# Patient Record
Sex: Female | Born: 2000
Health system: Southern US, Community
[De-identification: ages and names within clinical notes are randomized; demographics above are authoritative.]

## PROBLEM LIST (undated history)

## (undated) HISTORY — PX: NO PAST SURGERIES: SHX2092

---
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
## 2022-01-09 ENCOUNTER — Other Ambulatory Visit: Payer: Self-pay | Admitting: Nurse Practitioner

## 2022-01-09 MED ORDER — LEVONORGESTREL-ETHINYL ESTRAD 0.15-30 MG-MCG PO TABS: 1.0000 | ORAL_TABLET | Freq: Every day | ORAL | 0 refills | Status: DC

## 2022-01-09 NOTE — Telephone Encounter (Signed)
Patient called, left VM to return the call to the office to scheduled an appt for medication refill request.   

## 2022-01-09 NOTE — Telephone Encounter (Signed)
Requested medication (s) are due for refill today: yes  Requested medication (s) are on the active medication list: yes  Last refill:  10/14/21 #28/1  Future visit scheduled: no  Notes to clinic:  Unable to refill per protocol, appointment needed. Called pt, LVMTCB     Requested Prescriptions  Pending Prescriptions Disp Refills   KURVELO 0.15-30 MG-MCG tablet [Pharmacy Med Name: KURVELO TABLETS 28S] 28 tablet 1    Sig: TAKE 1 TABLET BY MOUTH EVERY DAY     OB/GYN:  Contraceptives Failed - 01/09/2022  4:36 PM      Failed - Valid encounter within last 12 months    Recent Outpatient Visits           1 year ago Menorrhagia with irregular cycle   [facility] [place], Dorie Rank, NP   1 year ago Cough   [facility] [place], Corrie Dandy T, NP   1 year ago Appointment canceled by hospital   [facility] [place], Megan P, DO   2 years ago Menorrhagia with irregular cycle   [facility] [place], Corrie Dandy T, NP   2 years ago Encounter to establish care   [facility] [place], [place] T, NP              Passed - Last BP in normal range    BP Readings from Last 1 Encounters:  10/23/20 100/69         Passed - Patient is not a smoker

## 2022-01-09 NOTE — Telephone Encounter (Signed)
Pt is due for an appt.  Requested Prescriptions  Pending Prescriptions Disp Refills  . KURVELO 0.15-30 MG-MCG tablet [Pharmacy Med Name: Williemae Natter TABLETS 28S] 84 tablet     Sig: TAKE 1 TABLET BY MOUTH DAILY     OB/GYN:  Contraceptives Failed - 01/09/2022  5:30 PM      Failed - Valid encounter within last 12 months    Recent Outpatient Visits          1 year ago Menorrhagia with irregular cycle   [facility] [place], Dorie Rank, NP   1 year ago Cough   [facility] [place], Corrie Dandy T, NP   1 year ago Appointment canceled by hospital   [facility], Megan P, DO   2 years ago Menorrhagia with irregular cycle   [facility] [place], Corrie Dandy T, NP   2 years ago Encounter to establish care   [facility] [place], Dorie Rank, NP      Future Appointments            Tomorrow Larae Grooms, NP [facility], [facility]           Passed - Last BP in normal range    BP Readings from Last 1 Encounters:  10/23/20 100/69         Passed - Patient is not a smoker

## 2022-01-09 NOTE — Telephone Encounter (Signed)
Copied from CRM [id number]. Topic: General - Other >> Jan 09, 2022  4:37 PM Everette C wrote: Reason for CRM: Medication Refill - Medication: Jeanne Benson 0.15-30 MG-MCG tablet [[id number]]  Has the patient contacted their pharmacy? Yes.  The patient has been directed to contact their PCP (Agent: If no, request that the patient contact the pharmacy for the refill. If patient does not wish to contact the pharmacy document the reason why and proceed with request.) (Agent: If yes, when and what did the pharmacy advise?)  Preferred Pharmacy (with phone number or street name): [id number] [name], [place] - [address] AT [facility] [address] [place] [address] [place] [address] Phone: [phone] Fax: [phone] Hours: Not open 24 hours   Has the patient been seen for an appointment in the last year OR does the patient have an upcoming appointment? Yes.    Agent: Please be advised that RX refills may take up to 3 business days. We ask that you follow-up with your pharmacy.

## 2022-01-09 NOTE — Telephone Encounter (Signed)
Patient will need an office visit for further refills Courtesy refill. Requested Prescriptions  Pending Prescriptions Disp Refills  . levonorgestrel-ethinyl estradiol (KURVELO) 0.15-30 MG-MCG tablet 28 tablet 0    Sig: Take 1 tablet by mouth daily.     OB/GYN:  Contraceptives Failed - 01/09/2022  5:15 PM      Failed - Valid encounter within last 12 months    Recent Outpatient Visits          1 year ago Menorrhagia with irregular cycle   [facility], Dorie Rank, NP   1 year ago Cough   [facility] [place], Corrie Dandy T, NP   1 year ago Appointment canceled by hospital   [facility], Megan P, DO   2 years ago Menorrhagia with irregular cycle   [facility] [place], Corrie Dandy T, NP   2 years ago Encounter to establish care   [facility] [place], [place]raw T, NP             Passed - Last BP in normal range    BP Readings from Last 1 Encounters:  10/23/20 100/69         Passed - Patient is not a smoker

## 2022-01-09 NOTE — Telephone Encounter (Signed)
Called pt to make appt. LMOM ?

## 2022-01-10 ENCOUNTER — Encounter: Payer: Self-pay | Admitting: Nurse Practitioner

## 2022-01-10 ENCOUNTER — Ambulatory Visit (INDEPENDENT_AMBULATORY_CARE_PROVIDER_SITE_OTHER): Payer: 59 | Admitting: Nurse Practitioner

## 2022-01-10 VITALS — BP 129/82 | HR 69 | Temp 98.6°F | Wt 145.0 lb

## 2022-01-10 DIAGNOSIS — Z3009 Encounter for other general counseling and advice on contraception: Secondary | ICD-10-CM | POA: Diagnosis not present

## 2022-01-10 MED ORDER — LEVONORGESTREL-ETHINYL ESTRAD 0.15-30 MG-MCG PO TABS: 1.0000 | ORAL_TABLET | Freq: Every day | ORAL | 3 refills | Status: DC

## 2022-01-10 NOTE — Progress Notes (Signed)
BP 129/82   Pulse 69   Temp 98.6 F (37 C) (Oral)   Wt 145 lb (65.8 kg)   LMP 01/09/2022   SpO2 99%    Subjective:    Patient ID: Jeanne Benson, female    DOB: August 09, 2000, 21 y.o.   MRN: [id number]  HPI: Jeanne Benson is a 21 y.o. female  Chief Complaint  Patient presents with   Medication Refill    Refill on birth control    Patient presents to clinic for medication refill.  She needs a refill on her birth control.  States medication is working well for her.  Denies concerns at visit today.     Relevant past medical, surgical, family and social history reviewed and updated as indicated. Interim medical history since our last visit reviewed. Allergies and medications reviewed and updated.  Review of Systems  Genitourinary:  Negative for menstrual problem.    Per HPI unless specifically indicated above     Objective:    BP 129/82   Pulse 69   Temp 98.6 F (37 C) (Oral)   Wt 145 lb (65.8 kg)   LMP 01/09/2022   SpO2 99%   Wt Readings from Last 3 Encounters:  01/10/22 145 lb (65.8 kg) (77 %, Z= 0.73)*  10/23/20 151 lb 12.8 oz (68.9 kg) (85 %, Z= 1.05)*  09/30/19 165 lb (74.8 kg) (93 %, Z= 1.45)*   * Growth percentiles are based on CDC (Girls, 2-20 Years) data.    Physical Exam Vitals and nursing note reviewed.  Constitutional:      General: She is not in acute distress.    Appearance: Normal appearance. She is normal weight. She is not ill-appearing, toxic-appearing or diaphoretic.  HENT:     Head: Normocephalic.     Right Ear: External ear normal.     Left Ear: External ear normal.     Nose: Nose normal.     Mouth/Throat:     Mouth: Mucous membranes are moist.     Pharynx: Oropharynx is clear.  Eyes:     General:        Right eye: No discharge.        Left eye: No discharge.     Extraocular Movements: Extraocular movements intact.     Conjunctiva/sclera: Conjunctivae normal.     Pupils: Pupils are equal, round, and reactive to light.   Cardiovascular:     Rate and Rhythm: Normal rate and regular rhythm.     Heart sounds: No murmur heard. Pulmonary:     Effort: Pulmonary effort is normal. No respiratory distress.     Breath sounds: Normal breath sounds. No wheezing or rales.  Musculoskeletal:     Cervical back: Normal range of motion and neck supple.  Skin:    General: Skin is warm and dry.     Capillary Refill: Capillary refill takes less than 2 seconds.  Neurological:     General: No focal deficit present.     Mental Status: She is alert and oriented to person, place, and time. Mental status is at baseline.  Psychiatric:        Mood and Affect: Mood normal.        Behavior: Behavior normal.        Thought Content: Thought content normal.        Judgment: Judgment normal.     Results for orders placed or performed in visit on 10/23/20  CBC with Differential/Platelet  Result Value Ref Range  WBC 7.1 3.4 - 10.8 x10E3/uL   RBC 4.19 3.77 - 5.28 x10E6/uL   Hemoglobin 11.7 11.1 - 15.9 g/dL   Hematocrit 36.5 34.0 - 46.6 %   MCV 87 79 - 97 fL   MCH 27.9 26.6 - 33.0 pg   MCHC 32.1 31.5 - 35.7 g/dL   RDW 14.5 11.7 - 15.4 %   Platelets 431 150 - 450 x10E3/uL   Neutrophils 53 Not Estab. %   Lymphs 38 Not Estab. %   Monocytes 7 Not Estab. %   Eos 1 Not Estab. %   Basos 1 Not Estab. %   Neutrophils Absolute 3.8 1.4 - 7.0 x10E3/uL   Lymphocytes Absolute 2.7 0.7 - 3.1 x10E3/uL   Monocytes Absolute 0.5 0.1 - 0.9 x10E3/uL   EOS (ABSOLUTE) 0.1 0.0 - 0.4 x10E3/uL   Basophils Absolute 0.0 0.0 - 0.2 x10E3/uL   Immature Granulocytes 0 Not Estab. %   Immature Grans (Abs) 0.0 0.0 - 0.1 x10E3/uL  Comprehensive metabolic panel  Result Value Ref Range   Glucose 64 (L) 65 - 99 mg/dL   BUN 14 6 - 20 mg/dL   Creatinine, Ser 0.64 0.57 - 1.00 mg/dL   eGFR 131 >59 mL/min/1.73   BUN/Creatinine Ratio 22 9 - 23   Sodium 143 134 - 144 mmol/L   Potassium 3.9 3.5 - 5.2 mmol/L   Chloride 101 96 - 106 mmol/L   CO2 25 20 - 29  mmol/L   Calcium 9.2 8.7 - 10.2 mg/dL   Total Protein 6.7 6.0 - 8.5 g/dL   Albumin 4.3 3.9 - 5.0 g/dL   Globulin, Total 2.4 1.5 - 4.5 g/dL   Albumin/Globulin Ratio 1.8 1.2 - 2.2   Bilirubin Total <0.2 0.0 - 1.2 mg/dL   Alkaline Phosphatase 43 42 - 106 IU/L   AST 15 0 - 40 IU/L   ALT 17 0 - 32 IU/L  Pregnancy, urine  Result Value Ref Range   Preg Test, Ur Negative Negative  Iron, TIBC and Ferritin Panel  Result Value Ref Range   Total Iron Binding Capacity 373 250 - 450 ug/dL   UIBC 346 131 - 425 ug/dL   Iron 27 27 - 159 ug/dL   Iron Saturation 7 (LL) 15 - 55 %   Ferritin 32 15 - 77 ng/mL      Assessment & Plan:   Problem List Items Addressed This Visit       Other   Birth control counseling - Primary    Well controlled with current medication.  Denies break through bleeding. Will send 1 year refill to pharmacy.  Follow up in 1 year for reevaluation.         Follow up plan: Return in about 1 year (around 01/11/2023) for Physical and Fasting labs.

## 2022-01-10 NOTE — Addendum Note (Signed)
Addended by: Larae Grooms on: 01/10/2022 02:11 PM   Modules accepted: Orders

## 2022-01-10 NOTE — Assessment & Plan Note (Signed)
Well controlled with current medication.  Denies break through bleeding. Will send 1 year refill to pharmacy.  Follow up in 1 year for reevaluation.

## 2022-02-05 ENCOUNTER — Other Ambulatory Visit: Payer: Self-pay | Admitting: Nurse Practitioner

## 2022-02-05 NOTE — Telephone Encounter (Signed)
Rx 01/10/22 #84 3RF- 1 yr Requested Prescriptions  Pending Prescriptions Disp Refills  . KURVELO 0.15-30 MG-MCG tablet [Pharmacy Med Name: Williemae Natter TABLETS 28S] 28 tablet     Sig: TAKE 1 TABLET BY MOUTH DAILY     OB/GYN:  Contraceptives Passed - 02/05/2022  3:15 AM      Passed - Last BP in normal range    BP Readings from Last 1 Encounters:  01/10/22 129/82         Passed - Valid encounter within last 12 months    Recent Outpatient Visits          3 weeks ago Birth control counseling   [facility] Larae Grooms, NP   1 year ago Menorrhagia with irregular cycle   [facility] [place], Dorie Rank, NP   1 year ago Cough   [facility] [place], Corrie Dandy T, NP   1 year ago Appointment canceled by hospital   [facility], Megan P, DO   2 years ago Menorrhagia with irregular cycle   [facility] [place], Dorie Rank, NP      Future Appointments            In 8 months Cann[place], Dorie Rank, NP [facility], [facility]           Passed - Patient is not a smoker

## 2022-04-14 ENCOUNTER — Other Ambulatory Visit: Payer: Self-pay

## 2022-04-14 MED ORDER — LEVONORGESTREL-ETHINYL ESTRAD 0.15-30 MG-MCG PO TABS
1.0000 | ORAL_TABLET | Freq: Every day | ORAL | 3 refills | Status: DC
Start: 2022-04-14 — End: 2022-04-18

## 2022-04-14 NOTE — Telephone Encounter (Signed)
[facility] req 90 days for birth control. Last seen in office 01/10/2022. Upcoming appt for 10/24/2022, please advise.

## 2022-04-18 ENCOUNTER — Other Ambulatory Visit: Payer: Self-pay | Admitting: Nurse Practitioner

## 2022-04-18 MED ORDER — LEVONORGESTREL-ETHINYL ESTRAD 0.15-30 MG-MCG PO TABS: 1.0000 | ORAL_TABLET | Freq: Every day | ORAL | 3 refills | Status: DC

## 2022-04-18 NOTE — Telephone Encounter (Signed)
Change of pharmacy  Requested Prescriptions  Pending Prescriptions Disp Refills  . levonorgestrel-ethinyl estradiol (KURVELO) 0.15-30 MG-MCG tablet 84 tablet 3    Sig: Take 1 tablet by mouth daily.     OB/GYN:  Contraceptives Passed - 04/18/2022 12:31 PM      Passed - Last BP in normal range    BP Readings from Last 1 Encounters:  01/10/22 129/82         Passed - Valid encounter within last 12 months    Recent Outpatient Visits          3 months ago Birth control counseling   [facility] Jon Billings, NP   1 year ago Menorrhagia with irregular cycle   [place], Barbaraann Faster, NP   1 year ago Cough   [place], Barbaraann Faster, NP   1 year ago Appointment canceled by hospital   [facility], Megan P, DO   2 years ago Menorrhagia with irregular cycle   [place], Barbaraann Faster, NP      Future Appointments            In 6 months Cannady, Barbaraann Faster, NP [employer], [place] - Patient is not a smoker

## 2022-04-18 NOTE — Telephone Encounter (Signed)
Copied from [place] [id number]. Topic: General - Other >> Apr 18, 2022 11:28 AM Everette C wrote: Reason for CRM: Medication Refill - Medication: levonorgestrel-ethinyl estradiol (KURVELO) 0.15-30 MG-MCG tablet [[id number]]   Has the patient contacted their pharmacy? Yes.  The patient's pharmacy has made contact on their behalf  (Agent: If no, request that the patient contact the pharmacy for the refill. If patient does not wish to contact the pharmacy document the reason why and proceed with request.) (Agent: If yes, when and what did the pharmacy advise?)  Preferred Pharmacy (with phone number or street name): [place], [place] [address] Phone: [phone] Fax: [phone] Hours: Not open 24 hours   Has the patient been seen for an appointment in the last year OR does the patient have an upcoming appointment? Yes.    Agent: Please be advised that RX refills may take up to 3 business days. We ask that you follow-up with your pharmacy.

## 2022-10-24 ENCOUNTER — Encounter: Payer: 59 | Admitting: Nurse Practitioner

## 2022-12-16 ENCOUNTER — Telehealth: Payer: Self-pay

## 2022-12-16 NOTE — Telephone Encounter (Signed)
LVM for patient to call back [phone], or to call PCP office to schedule follow up apt. AS, CMA  

## 2023-02-09 ENCOUNTER — Telehealth: Payer: Self-pay | Admitting: Nurse Practitioner

## 2023-02-09 NOTE — Telephone Encounter (Signed)
Physicals are out til October 30th with her provider.  Not sure if there is somewhere she could be worked in.  Please advise.

## 2023-02-09 NOTE — Telephone Encounter (Signed)
Copied from CRM [id number]. Topic: Appointment Scheduling - Scheduling Inquiry for Clinic >> Feb 09, 2023 12:07 PM Jeanne Benson wrote: Reason for CRM: Pt is calling back stating that she does not remember where she received her last physical from nor the date. Pt states that she is needing her physical to be done before she start school on 02/13/2023. Pt is wanting to see if she can get Benson physical this week. Please call pt back.

## 2023-02-09 NOTE — Telephone Encounter (Signed)
LVM for patient to call office back, can be scheduled with Erin or Rashelle.  Put in CRM.

## 2023-02-09 NOTE — Telephone Encounter (Signed)
PCP has no openings this week. Please call patient and schedule her with another provider if she needs the appointment this week.

## 2023-02-11 ENCOUNTER — Ambulatory Visit (INDEPENDENT_AMBULATORY_CARE_PROVIDER_SITE_OTHER): Payer: 59 | Admitting: Physician Assistant

## 2023-02-11 ENCOUNTER — Encounter: Payer: Self-pay | Admitting: Physician Assistant

## 2023-02-11 VITALS — BP 100/67 | HR 76 | Ht 69.5 in | Wt 154.4 lb

## 2023-02-11 DIAGNOSIS — Z7189 Other specified counseling: Secondary | ICD-10-CM | POA: Insufficient documentation

## 2023-02-11 DIAGNOSIS — R7989 Other specified abnormal findings of blood chemistry: Secondary | ICD-10-CM

## 2023-02-11 DIAGNOSIS — Z0289 Encounter for other administrative examinations: Secondary | ICD-10-CM

## 2023-02-11 DIAGNOSIS — Z136 Encounter for screening for cardiovascular disorders: Secondary | ICD-10-CM

## 2023-02-11 DIAGNOSIS — Z1159 Encounter for screening for other viral diseases: Secondary | ICD-10-CM

## 2023-02-11 DIAGNOSIS — Z0000 Encounter for general adult medical examination without abnormal findings: Secondary | ICD-10-CM | POA: Diagnosis not present

## 2023-02-11 DIAGNOSIS — Z131 Encounter for screening for diabetes mellitus: Secondary | ICD-10-CM

## 2023-02-11 DIAGNOSIS — Z114 Encounter for screening for human immunodeficiency virus [HIV]: Secondary | ICD-10-CM

## 2023-02-11 NOTE — Progress Notes (Signed)
Annual Physical Exam   Name: Jeanne Benson   MRN: [id number]    DOB: 09-25-2000   Date:02/11/2023  Today's Provider: Jacquelin Hawking, MHS, PA-C Introduced myself to the patient as a PA-C and provided education on APPs in clinical practice.         Subjective  Chief Complaint  Chief Complaint  Patient presents with   Annual Exam   Form Completion    HPI  Patient presents for annual CPE.  Diet: overall normal dietary intake  Exercise: She not engaged in regular exercise   Sleep:" pretty good" getting about 6-7 hours per night, feels well rested in the AM  Mood:"good"    Depression: Phq 9 is  negative    02/11/2023   10:46 AM 01/10/2022    8:28 AM 10/23/2020    1:19 PM  Depression screen PHQ 2/9  Decreased Interest 0 1 0  Down, Depressed, Hopeless 0 0 0  PHQ - 2 Score 0 1 0  Altered sleeping 0 0   Tired, decreased energy 0 0   Change in appetite 0 0   Feeling bad or failure about yourself  0 0   Trouble concentrating 0 0   Moving slowly or fidgety/restless 0 0   Suicidal thoughts 0 0   PHQ-9 Score 0 1   Difficult doing work/chores Not difficult at all Not difficult at all    Hypertension: BP Readings from Last 3 Encounters:  02/11/23 100/67  01/10/22 129/82  10/23/20 100/69   Obesity: Wt Readings from Last 3 Encounters:  02/11/23 154 lb 6.4 oz (70 kg)  01/10/22 145 lb (65.8 kg) (77%, Z= 0.73)*  10/23/20 151 lb 12.8 oz (68.9 kg) (85%, Z= 1.05)*   * Growth percentiles are based on CDC (Girls, 2-20 Years) data.   BMI Readings from Last 3 Encounters:  02/11/23 22.47 kg/m  08/31/19 24.81 kg/m (84%, Z= 0.98)*   * Growth percentiles are based on CDC (Girls, 2-20 Years) data.     Vaccines:   HPV: completed  Tdap: UTD  Shingrix: NA Pneumonia: NA Flu: postponed until fall  COVID-19: Discussed vaccine and booster recommendations per available CDC guidelines     Hep C Screening: ordered today  HIV screening: ordered today  STD testing and  prevention (HIV/chl/gon/syphilis): declines today  Intimate partner violence: negative Sexual History : She is sexually active with single monogamous female partner  Menstrual History/LMP/Abnormal Bleeding: LMP was 01/29/23-02/02/23 Discussed importance of follow up if any post-menopausal bleeding: not applicable Incontinence Symptoms: No.  Breast cancer hx: she denies family hx of breast cancer  - Last Mammogram: Recommend routine screening at age 75  - BRCA gene screening:   Osteoporosis Prevention : Discussed high calcium and vitamin D supplementation, weight bearing exercises Bone density :not applicable  Cervical cancer screening: not due yet   Skin cancer: Discussed monitoring for atypical lesions  Colorectal cancer screening: She denies known family hx of colon cancer, recommend routine screening at age 13    Lung cancer:  Low Dose CT Chest recommended if Age 21-80 years, 20 pack-year currently smoking OR have quit w/in 15years. Patient does not qualify.   ECG: NA  Advanced Care Planning: A voluntary discussion about advance care planning including the explanation and discussion of advance directives.  Discussed health care proxy and Living will, and the patient was able to identify a health care proxy as no one.  Patient does not have a living will in effect.  Lipids: No results found for: "CHOL" No results found for: "HDL" No results found for: "LDLCALC" No results found for: "TRIG" No results found for: "CHOLHDL" No results found for: "LDLDIRECT"  Glucose: Glucose  Date Value Ref Range Status  10/23/2020 64 (L) 65 - 99 mg/dL Final  11/91/4782 77 65 - 99 mg/dL Final    Patient Active Problem List   Diagnosis Date Noted   Advanced care planning/counseling discussion 02/11/2023   Iron deficiency anemia 09/30/2019   Menorrhagia with irregular cycle 08/31/2019   Birth control counseling 08/31/2019    Past Surgical History:  Procedure Laterality Date   NO PAST SURGERIES       Family History  Problem Relation Age of Onset   Asthma Mother    COPD Maternal Grandmother    Lung cancer Maternal Grandfather    Heart disease Maternal Grandfather    Heart disease Paternal Grandfather     Social History   Socioeconomic History   Marital status: Single    Spouse name: Not on file   Number of children: Not on file   Years of education: Not on file   Highest education level: Not on file  Occupational History   Not on file  Tobacco Use   Smoking status: Never   Smokeless tobacco: Never  Vaping Use   Vaping status: Never Used  Substance and Sexual Activity   Alcohol use: No    Alcohol/week: 0.0 standard drinks of alcohol   Drug use: No   Sexual activity: Not on file  Other Topics Concern   Not on file  Social History Narrative   Not on file   Social Determinants of Health   Financial Resource Strain: Not on file  Food Insecurity: Not on file  Transportation Needs: Not on file  Physical Activity: Not on file  Stress: Not on file  Social Connections: Not on file  Intimate Partner Violence: Not on file     Current Outpatient Medications:    levonorgestrel-ethinyl estradiol (KURVELO) 0.15-30 MG-MCG tablet, Take 1 tablet by mouth daily., Disp: 84 tablet, Rfl: 3  No Known Allergies   Review of Systems  Constitutional:  Negative for chills, fever, malaise/fatigue and weight loss.  HENT:  Negative for hearing loss, nosebleeds, sore throat and tinnitus.   Eyes:  Negative for blurred vision, double vision and photophobia.  Respiratory:  Negative for shortness of breath and wheezing.   Cardiovascular:  Negative for chest pain, palpitations and leg swelling.  Gastrointestinal:  Negative for blood in stool, constipation, diarrhea, heartburn, nausea and vomiting.  Genitourinary:  Negative for dysuria and hematuria.  Musculoskeletal:  Negative for falls, joint pain and myalgias.  Skin:  Negative for itching and rash.  Neurological:  Negative for  dizziness, tingling, tremors, loss of consciousness, weakness and headaches.  Psychiatric/Behavioral:  Negative for depression, memory loss, substance abuse and suicidal ideas. The patient is not nervous/anxious.    Vision Screening   Right eye Left eye Both eyes  Without correction 20/70 20/40 20/40   With correction         Objective  Vitals:   02/11/23 1040  BP: 100/67  Pulse: 76  SpO2: 98%  Weight: 154 lb 6.4 oz (70 kg)  Height: 5' 9.5" (1.765 m)    Body mass index is 22.47 kg/m.  Physical Exam Vitals reviewed.  Constitutional:      General: She is awake.     Appearance: Normal appearance. She is well-developed and well-groomed.  HENT:  Head: Normocephalic and atraumatic.     Right Ear: Hearing, tympanic membrane and ear canal normal.     Left Ear: Hearing, tympanic membrane and ear canal normal.     Mouth/Throat:     Lips: Pink.     Mouth: Mucous membranes are moist.     Pharynx: Oropharynx is clear. Uvula midline. No pharyngeal swelling, oropharyngeal exudate, posterior oropharyngeal erythema or uvula swelling.     Tonsils: No tonsillar exudate or tonsillar abscesses.  Eyes:     General: Lids are normal. Gaze aligned appropriately.     Extraocular Movements: Extraocular movements intact.     Conjunctiva/sclera: Conjunctivae normal.     Pupils: Pupils are equal, round, and reactive to light.  Neck:     Thyroid: No thyroid mass, thyromegaly or thyroid tenderness.  Cardiovascular:     Rate and Rhythm: Normal rate and regular rhythm.     Pulses: Normal pulses.          Radial pulses are 2+ on the right side and 2+ on the left side.     Heart sounds: Normal heart sounds. No murmur heard.    No friction rub. No gallop.     Comments: Examined in [place] - no changes or concerning heart sounds  Pulmonary:     Effort: Pulmonary effort is normal.     Breath sounds: Normal breath sounds. No decreased air movement. No decreased breath sounds, wheezing, rhonchi or  rales.  Abdominal:     General: Abdomen is flat. Bowel sounds are normal.     Palpations: Abdomen is soft.     Tenderness: There is no abdominal tenderness.  Musculoskeletal:     Right shoulder: Normal.     Left shoulder: Normal.     Right upper arm: Normal.     Left upper arm: Normal.     Cervical back: Normal, normal range of motion and neck supple. Normal range of motion.     Thoracic back: Normal. Normal range of motion.     Lumbar back: Normal. Normal range of motion.     Right hip: Normal range of motion.     Left hip: Normal range of motion.     Right knee: Normal range of motion.     Left knee: Normal range of motion.     Right lower leg: No edema.     Left lower leg: No edema.     Right foot: Normal range of motion.     Left foot: Normal range of motion.  Lymphadenopathy:     Head:     Right side of head: No submental, submandibular or preauricular adenopathy.     Left side of head: No submental, submandibular or preauricular adenopathy.     Cervical:     Right cervical: No superficial or posterior cervical adenopathy.    Left cervical: No superficial or posterior cervical adenopathy.     Upper Body:     Right upper body: No supraclavicular adenopathy.     Left upper body: No supraclavicular adenopathy.  Neurological:     General: No focal deficit present.     Mental Status: She is alert and oriented to person, place, and time. Mental status is at baseline.     GCS: GCS eye subscore is 4. GCS verbal subscore is 5. GCS motor subscore is 6.     Cranial Nerves: No cranial nerve deficit, dysarthria or facial asymmetry.     Motor: No weakness, tremor, atrophy or abnormal muscle tone.  Gait: Gait is intact. Gait normal.  Psychiatric:        Attention and Perception: Attention and perception normal.        Mood and Affect: Mood and affect normal.        Speech: Speech normal.        Behavior: Behavior normal. Behavior is cooperative.        Thought Content: Thought  content normal.        Cognition and Memory: Cognition normal.        Judgment: Judgment normal.      No results found for this or any previous visit (from the past 2160 hour(s)).   Fall Risk:    02/11/2023   10:46 AM 01/10/2022    8:27 AM 08/31/2019    1:11 PM  Fall Risk   Falls in the past year? 0 0 0  Number falls in past yr: 0 0 0  Injury with Fall? 0 0 0  Risk for fall due to : No Fall Risks No Fall Risks   Follow up Falls evaluation completed Falls evaluation completed Falls evaluation completed     Functional Status Survey:     Assessment & Plan  Problem List Items Addressed This Visit       Other   Advanced care planning/counseling discussion    A voluntary discussion about advance care planning including the explanation and discussion of advance directives was extensively discussed  with the patient for 5 minutes with patient and myself present.  Explanation about the health care proxy and Living will was reviewed and packet with forms with explanation of how to fill them out was given.  During this discussion, the patient was not  able to identify a health care proxy and plans to fill out the paperwork required.  Patient was offered a separate Advance Care Planning visit for further assistance with forms.         Other Visit Diagnoses     Annual physical exam    -  Primary  -USPSTF grade A and B recommendations reviewed with patient; age-appropriate recommendations, preventive care, screening tests, etc discussed and encouraged; healthy living encouraged; see AVS for patient education given to patient -Discussed importance of 150 minutes of physical activity weekly, eat two servings of fish weekly, eat one serving of tree nuts ( cashews, pistachios, pecans, almonds.Marland Kitchen) every other day, eat 6 servings of fruit/vegetables daily and drink plenty of water and avoid sweet beverages.   -Reviewed Health Maintenance: Yes.      Relevant Orders   Comp Met (CMET)   CBC  w/Diff   HIV antibody (with reflex)   Hepatitis C antibody   Lipid Profile   HgB A1c   TSH   Encounter for completion of form with patient     See scanned form for more information  Sports physical form for [place]     Screening for ischemic heart disease       Relevant Orders   Lipid Profile   Encounter for hepatitis C screening test for low risk patient       Relevant Orders   Hepatitis C antibody   Screening for HIV without presence of risk factors       Relevant Orders   HIV antibody (with reflex)   Screening for diabetes mellitus (DM)       Relevant Orders   HgB A1c        No follow-ups on file.   I, Subhan Hoopes E Kember Boch, PA-C,  have reviewed all documentation for this visit. The documentation on 02/11/23 for the exam, diagnosis, procedures, and orders are all accurate and complete.   Jacquelin Hawking, MHS, PA-C [facility]

## 2023-02-11 NOTE — Assessment & Plan Note (Signed)

## 2023-02-12 LAB — CBC WITH DIFFERENTIAL/PLATELET
Basophils Absolute: 0 10*3/uL (ref 0.0–0.2)
Basos: 1 %
EOS (ABSOLUTE): 0.1 10*3/uL (ref 0.0–0.4)
Eos: 3 %
Hematocrit: 35.6 % (ref 34.0–46.6)
Hemoglobin: 11.4 g/dL (ref 11.1–15.9)
Immature Grans (Abs): 0 10*3/uL (ref 0.0–0.1)
Immature Granulocytes: 0 %
Lymphocytes Absolute: 1.7 10*3/uL (ref 0.7–3.1)
Lymphs: 39 %
MCH: 28.1 pg (ref 26.6–33.0)
MCHC: 32 g/dL (ref 31.5–35.7)
MCV: 88 fL (ref 79–97)
Monocytes Absolute: 0.3 10*3/uL (ref 0.1–0.9)
Monocytes: 6 %
Neutrophils Absolute: 2.3 10*3/uL (ref 1.4–7.0)
Neutrophils: 51 %
Platelets: 268 10*3/uL (ref 150–450)
RBC: 4.06 x10E6/uL (ref 3.77–5.28)
RDW: 14.7 % (ref 11.7–15.4)
WBC: 4.3 10*3/uL (ref 3.4–10.8)

## 2023-02-12 LAB — LIPID PANEL
Chol/HDL Ratio: 3.7 ratio (ref 0.0–4.4)
Cholesterol, Total: 167 mg/dL (ref 100–199)
HDL: 45 mg/dL (ref >39)
LDL Chol Calc (NIH): 110 mg/dL — ABNORMAL HIGH (ref 0–99)
Triglycerides: 62 mg/dL (ref 0–149)
VLDL Cholesterol Cal: 12 mg/dL (ref 5–40)

## 2023-02-12 LAB — COMPREHENSIVE METABOLIC PANEL
ALT: 9 IU/L (ref 0–32)
AST: 19 IU/L (ref 0–40)
Albumin: 4.2 g/dL (ref 4.0–5.0)
Alkaline Phosphatase: 35 IU/L — ABNORMAL LOW (ref 42–106)
BUN/Creatinine Ratio: 13 (ref 9–23)
BUN: 9 mg/dL (ref 6–20)
Bilirubin Total: 0.4 mg/dL (ref 0.0–1.2)
CO2: 24 mmol/L (ref 20–29)
Calcium: 9.6 mg/dL (ref 8.7–10.2)
Chloride: 104 mmol/L (ref 96–106)
Creatinine, Ser: 0.68 mg/dL (ref 0.57–1.00)
Globulin, Total: 2.4 g/dL (ref 1.5–4.5)
Glucose: 83 mg/dL (ref 70–99)
Potassium: 4.4 mmol/L (ref 3.5–5.2)
Sodium: 139 mmol/L (ref 134–144)
Total Protein: 6.6 g/dL (ref 6.0–8.5)
eGFR: 128 mL/min/{1.73_m2} (ref >59)

## 2023-02-12 LAB — HEPATITIS C ANTIBODY: Hep C Virus Ab: NONREACTIVE

## 2023-02-12 LAB — HIV ANTIBODY (ROUTINE TESTING W REFLEX): HIV Screen 4th Generation wRfx: NONREACTIVE

## 2023-02-12 LAB — HEMOGLOBIN A1C
Est. average glucose Bld gHb Est-mCnc: 103 mg/dL
Hgb A1c MFr Bld: 5.2 % (ref 4.8–5.6)

## 2023-02-12 LAB — TSH: TSH: 0.385 u[IU]/mL — ABNORMAL LOW (ref 0.450–4.500)

## 2023-02-12 NOTE — Addendum Note (Signed)
Addended by: Jacquelin Hawking on: 02/12/2023 05:09 PM   Modules accepted: Orders

## 2023-02-12 NOTE — Progress Notes (Signed)
Your labs were overall normal and stable with the exception of your thyroid testing. I have submitted an extra lab request to the lab to see if they can run another test to make sure your other levels are okay. We will keep you updated on these results once they are back. For now please make sure you are staying active, well hydrated and eating a balanced diet.

## 2023-02-14 LAB — THYROID PANEL
Free Thyroxine Index: 2.1 (ref 1.2–4.9)
T3 Uptake Ratio: 26 % (ref 24–39)
T4, Total: 8.1 ug/dL (ref 4.5–12.0)

## 2023-02-14 LAB — SPECIMEN STATUS REPORT

## 2023-02-16 NOTE — Progress Notes (Signed)
Thyroid hormone levels were normal. No medications indicated at this time

## 2023-04-30 ENCOUNTER — Other Ambulatory Visit: Payer: Self-pay | Admitting: Nurse Practitioner

## 2023-04-30 NOTE — Telephone Encounter (Signed)
Requested Prescriptions  Pending Prescriptions Disp Refills   KURVELO 0.15-30 MG-MCG tablet [Pharmacy Med Name: Williemae Natter TABLETS 28S] 84 tablet 3    Sig: TAKE 1 TABLET BY MOUTH DAILY     OB/GYN:  Contraceptives Passed - 04/30/2023  3:23 AM      Passed - Last BP in normal range    BP Readings from Last 1 Encounters:  02/11/23 100/67         Passed - Valid encounter within last 12 months    Recent Outpatient Visits           2 months ago Annual physical exam   [place] [facility] Mecum, Oswaldo Conroy, PA-C   1 year ago Birth control counseling   [place] [facility] Larae Grooms, NP   2 years ago Menorrhagia with irregular cycle   [place] [facility] [place], Corrie Dandy T, NP   2 years ago Cough   [place] [facility] [place], Corrie Dandy T, NP   2 years ago Appointment canceled by hospital    [facility] Clarissa, [place], [place]              Passed - Patient is not a smoker

## 2024-03-23 ENCOUNTER — Encounter: Admitting: Nurse Practitioner

## 2024-04-17 ENCOUNTER — Other Ambulatory Visit: Payer: Self-pay | Admitting: Nurse Practitioner

## 2024-04-19 NOTE — Telephone Encounter (Signed)
 Requested Prescriptions  Pending Prescriptions Disp Refills   levonorgestrel -ethinyl estradiol (KURVELO) 0.15-30 MG-MCG tablet [Pharmacy Med Name: KURVELO TABLETS 28S] 84 tablet 0    Sig: TAKE 1 TABLET BY MOUTH DAILY     OB/GYN:  Contraceptives Failed - 04/19/2024 11:49 AM      Failed - Valid encounter within last 12 months    Recent Outpatient Visits   None            Passed - Last BP in normal range    BP Readings from Last 1 Encounters:  02/11/23 100/67         Passed - Patient is not a smoker

## 2024-04-19 NOTE — Telephone Encounter (Signed)
 Copied from CRM #[id number]. Topic: Clinical - Prescription Issue >> Apr 19, 2024 11:14 AM Delon DASEN wrote: Reason for CRM: KURVELO 0.15-30 MG-MCG tablet- per pharmacy there has been a delay with refill

## 2024-04-30 NOTE — Patient Instructions (Incomplete)
 Be Involved in Caring For Your Health:  Taking Medications When medications are taken as directed, they can greatly improve your health. But if they are not taken as prescribed, they may not work. In some cases, not taking them correctly can be harmful. To help ensure your treatment remains effective and safe, understand your medications and how to take them. Bring your medications to each visit for review by your provider.  Your lab results, notes, and after visit summary will be available on My Chart. We strongly encourage you to use this feature. If lab results are abnormal the clinic will contact you with the appropriate steps. If the clinic does not contact you assume the results are satisfactory. You can always view your results on My Chart. If you have questions regarding your health or results, please contact the clinic during office hours. You can also ask questions on My Chart.  We at [facility] are grateful that you chose us  to provide your care. We strive to provide evidence-based and compassionate care and are always looking for feedback. If you get a survey from the clinic please complete this so we can hear your opinions.  Healthy Eating, Adult Healthy eating may help you get and keep a healthy body weight, reduce the risk of chronic disease, and live a long and productive life. It is important to follow a healthy eating pattern. Your nutritional and calorie needs should be met mainly by different nutrient-rich foods. What are tips for following this plan? Reading food labels Read labels and choose the following: Reduced or low sodium products. Juices with 100% fruit juice. Foods with low saturated fats (<3 g per serving) and high polyunsaturated and monounsaturated fats. Foods with whole grains, such as whole wheat, cracked wheat, brown rice, and wild rice. Whole grains that are fortified with folic acid. This is recommended for females who are pregnant or who want to  become pregnant. Read labels and do not eat or drink the following: Foods or drinks with added sugars. These include foods that contain brown sugar, corn sweetener, corn syrup, dextrose , fructose, glucose, high-fructose corn syrup, honey, invert sugar, lactose, malt syrup, maltose, molasses, raw sugar, sucrose, trehalose, or turbinado sugar. Limit your intake of added sugars to less than 10% of your total daily calories. Do not eat more than the following amounts of added sugar per day: 6 teaspoons (25 g) for females. 9 teaspoons (38 g) for males. Foods that contain processed or refined starches and grains. Refined grain products, such as white flour, degermed cornmeal, white bread, and white rice. Shopping Choose nutrient-rich snacks, such as vegetables, whole fruits, and nuts. Avoid high-calorie and high-sugar snacks, such as potato chips, fruit snacks, and candy. Use oil-based dressings and spreads on foods instead of solid fats such as butter, margarine, sour cream, or cream cheese. Limit pre-made sauces, mixes, and instant products such as flavored rice, instant noodles, and ready-made pasta. Try more plant-protein sources, such as tofu, tempeh, black beans, edamame, lentils, nuts, and seeds. Explore eating plans such as the Mediterranean diet or vegetarian diet. Try heart-healthy dips made with beans and healthy fats like hummus and guacamole. Vegetables go great with these. Cooking Use oil to saut or stir-fry foods instead of solid fats such as butter, margarine, or lard. Try baking, boiling, grilling, or broiling instead of frying. Remove the fatty part of meats before cooking. Steam vegetables in water  or broth. Meal planning  At meals, imagine dividing your plate into fourths: One-half of  your plate is fruits and vegetables. One-fourth of your plate is whole grains. One-fourth of your plate is protein, especially lean meats, poultry, eggs, tofu, beans, or nuts. Include low-fat  dairy as part of your daily diet. Lifestyle Choose healthy options in all settings, including home, work, school, restaurants, or stores. Prepare your food safely: Wash your hands after handling raw meats. Where you prepare food, keep surfaces clean by regularly washing with hot, soapy water . Keep raw meats separate from ready-to-eat foods, such as fruits and vegetables. Cook seafood, meat, poultry, and eggs to the recommended temperature. Get a food thermometer. Store foods at safe temperatures. In general: Keep cold foods at 84F (4.4C) or below. Keep hot foods at 184F (60C) or above. Keep your freezer at [facility] (-17.8C) or below. Foods are not safe to eat if they have been between the temperatures of 40-184F (4.4-60C) for more than 2 hours. What foods should I eat? Fruits Aim to eat 1-2 cups of fresh, canned (in natural juice), or frozen fruits each day. One cup of fruit equals 1 small apple, 1 large banana, 8 large strawberries, 1 cup (237 g) canned fruit,  cup (82 g) dried fruit, or 1 cup (240 mL) 100% juice. Vegetables Aim to eat 2-4 cups of fresh and frozen vegetables each day, including different varieties and colors. One cup of vegetables equals 1 cup (91 g) broccoli or cauliflower florets, 2 medium carrots, 2 cups (150 g) raw, leafy greens, 1 large tomato, 1 large bell pepper, 1 large sweet potato, or 1 medium white potato. Grains Aim to eat 5-10 ounce-equivalents of whole grains each day. Examples of 1 ounce-equivalent of grains include 1 slice of bread, 1 cup (40 g) ready-to-eat cereal, 3 cups (24 g) popcorn, or  cup (93 g) cooked rice. Meats and other proteins Try to eat 5-7 ounce-equivalents of protein each day. Examples of 1 ounce-equivalent of protein include 1 egg,  oz nuts (12 almonds, 24 pistachios, or 7 walnut halves), 1/4 cup (90 g) cooked beans, 6 tablespoons (90 g) hummus or 1 tablespoon (16 g) peanut butter. A cut of meat or fish that is the size of a deck of  cards is about 3-4 ounce-equivalents (85 g). Of the protein you eat each week, try to have at least 8 sounce (227 g) of seafood. This is about 2 servings per week. This includes salmon, trout, herring, sardines, and anchovies. Dairy Aim to eat 3 cup-equivalents of fat-free or low-fat dairy each day. Examples of 1 cup-equivalent of dairy include 1 cup (240 mL) milk, 8 ounces (250 g) yogurt, 1 ounces (44 g) natural cheese, or 1 cup (240 mL) fortified soy milk. Fats and oils Aim for about 5 teaspoons (21 g) of fats and oils per day. Choose monounsaturated fats, such as canola and olive oils, mayonnaise made with olive oil or avocado oil, avocados, peanut butter, and most nuts, or polyunsaturated fats, such as sunflower, corn, and soybean oils, walnuts, pine nuts, sesame seeds, sunflower seeds, and flaxseed. Beverages Aim for 6 eight-ounce glasses of water  per day. Limit coffee to 3-5 eight-ounce cups per day. Limit caffeinated beverages that have added calories, such as soda and energy drinks. If you drink alcohol: Limit how much you have to: 0-1 drink a day if you are female. 0-2 drinks a day if you are female. Know how much alcohol is in your drink. In the [place] one drink is one 12 oz bottle of beer (355 mL), one 5 oz glass of wine (  148 mL), or one 1 oz glass of hard liquor (44 mL). Seasoning and other foods Try not to add too much salt to your food. Try using herbs and spices instead of salt. Try not to add sugar to food. This information is based on [place] nutrition guidelines. To learn more, visit DisposableNylon.be. Exact amounts may vary. You may need different amounts. This information is not intended to replace advice given to you by your health care provider. Make sure you discuss any questions you have with your health care provider. Document Revised: 03/17/2022 Document Reviewed: 03/17/2022 Elsevier Patient Education  2024 [employer].

## 2024-05-03 ENCOUNTER — Encounter: Admitting: Nurse Practitioner

## 2024-05-03 DIAGNOSIS — Z124 Encounter for screening for malignant neoplasm of cervix: Secondary | ICD-10-CM

## 2024-05-03 DIAGNOSIS — D508 Other iron deficiency anemias: Secondary | ICD-10-CM

## 2024-05-03 DIAGNOSIS — N921 Excessive and frequent menstruation with irregular cycle: Secondary | ICD-10-CM

## 2024-05-03 DIAGNOSIS — Z0000 Encounter for general adult medical examination without abnormal findings: Secondary | ICD-10-CM

## 2024-05-03 DIAGNOSIS — Z789 Other specified health status: Secondary | ICD-10-CM

## 2024-05-03 DIAGNOSIS — Z13220 Encounter for screening for lipoid disorders: Secondary | ICD-10-CM

## 2024-07-11 ENCOUNTER — Other Ambulatory Visit: Payer: Self-pay | Admitting: Nurse Practitioner

## 2024-07-12 NOTE — Telephone Encounter (Signed)
 Patient has not been seen since August 2024. Please call to schedule an appointment and then route to provider for possible refill until then.
